# Patient Record
Sex: Female | Born: 1975
Health system: Southern US, Community
[De-identification: ages and names within clinical notes are randomized; demographics above are authoritative.]

## PROBLEM LIST (undated history)

## (undated) DIAGNOSIS — G43009 Migraine without aura, not intractable, without status migrainosus: Secondary | ICD-10-CM

## (undated) DIAGNOSIS — F419 Anxiety disorder, unspecified: Secondary | ICD-10-CM

## (undated) DIAGNOSIS — E785 Hyperlipidemia, unspecified: Secondary | ICD-10-CM

## (undated) HISTORY — DX: Hyperlipidemia, unspecified: E78.5

## (undated) HISTORY — DX: Anxiety disorder, unspecified: F41.9

## (undated) HISTORY — DX: Migraine without aura, not intractable, without status migrainosus: G43.009

---

## 2003-04-28 ENCOUNTER — Other Ambulatory Visit: Admission: RE | Admit: 2003-04-28 | Discharge: 2003-04-28 | Payer: Self-pay | Admitting: Obstetrics and Gynecology

## 2003-11-23 ENCOUNTER — Inpatient Hospital Stay (HOSPITAL_COMMUNITY): Admission: AD | Admit: 2003-11-23 | Discharge: 2003-11-25 | Payer: Self-pay | Admitting: Obstetrics and Gynecology

## 2003-11-24 ENCOUNTER — Encounter (INDEPENDENT_AMBULATORY_CARE_PROVIDER_SITE_OTHER): Payer: Self-pay | Admitting: Specialist

## 2008-09-15 ENCOUNTER — Encounter: Admission: RE | Admit: 2008-09-15 | Discharge: 2008-09-15 | Payer: Self-pay | Admitting: Family Medicine

## 2010-01-26 IMAGING — CT CT HEAD W/O CM
1 series · 16 of 28 positions shown, 20 images · non-contrast
Comparison: None

CLINICAL DATA: Headaches.

CT HEAD WITHOUT CONTRAST
TECHNIQUE: Contiguous axial images were obtained from the base of
the skull through the vertex without contrast.

[Series 2: head · axial · 0.49mm/px · z∈[+18,+153]mm · 16 of 28 slices shown, 20 images]
[im 2/28  brain]
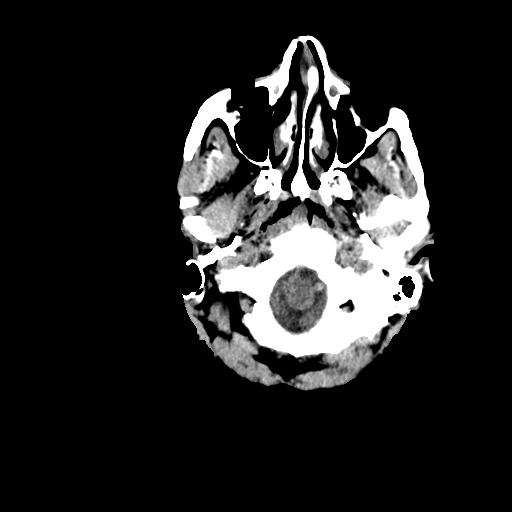
[im 2/28  bone]
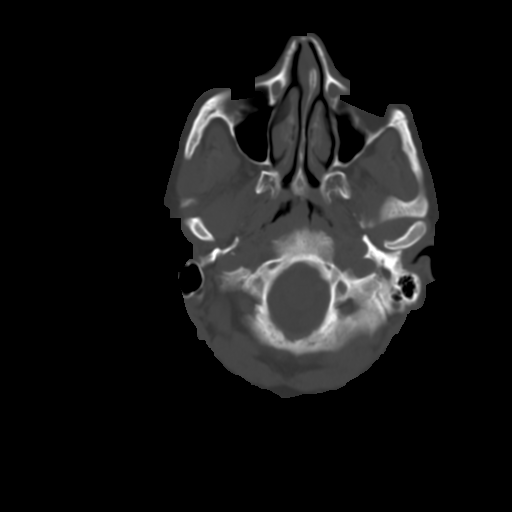
[im 4/28  brain]
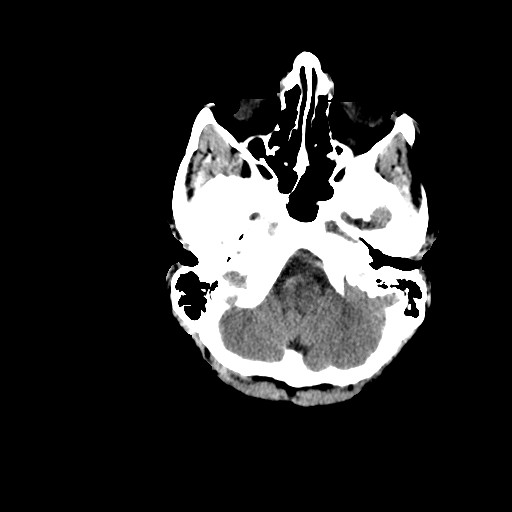
[im 6/28  brain]
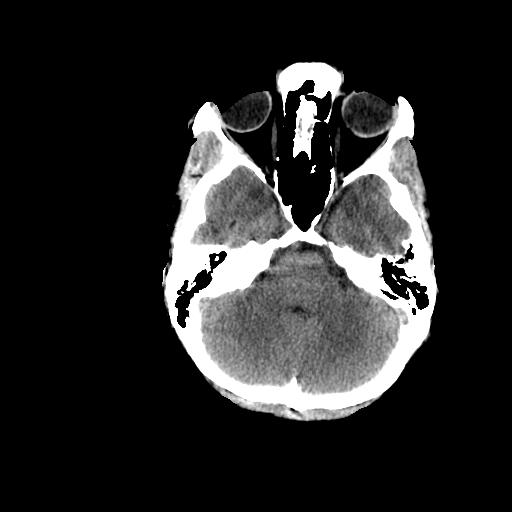
[im 7/28  brain]
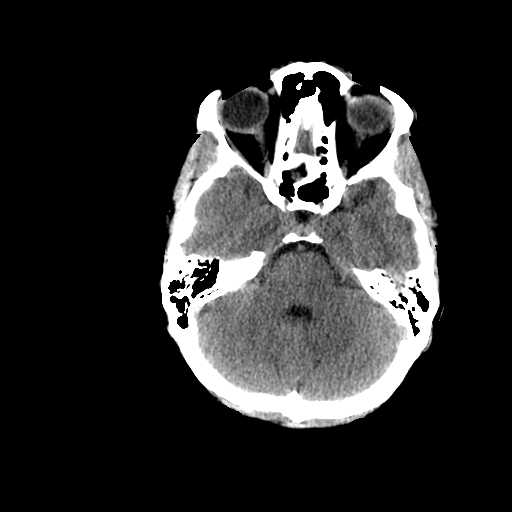
[im 9/28  brain]
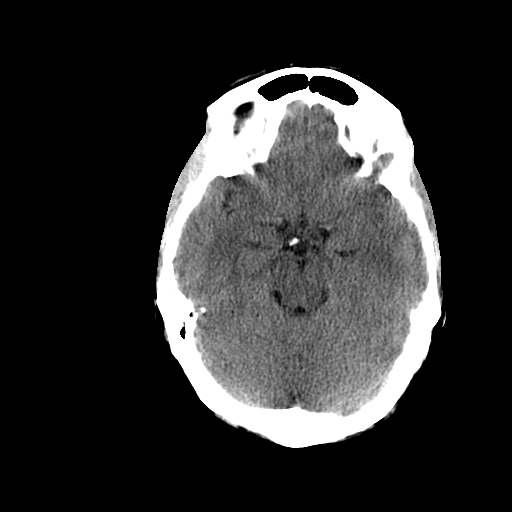
[im 9/28  bone]
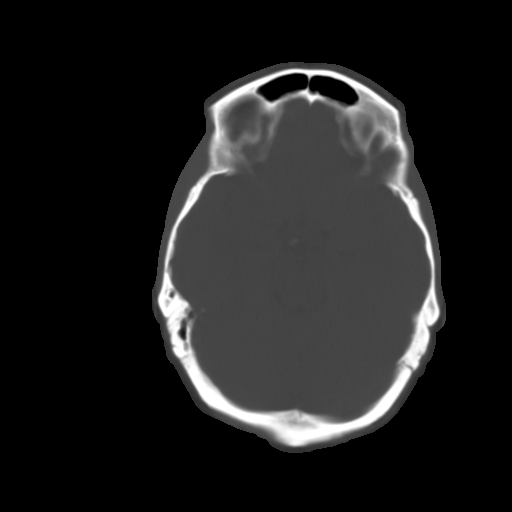
[im 10/28  brain]
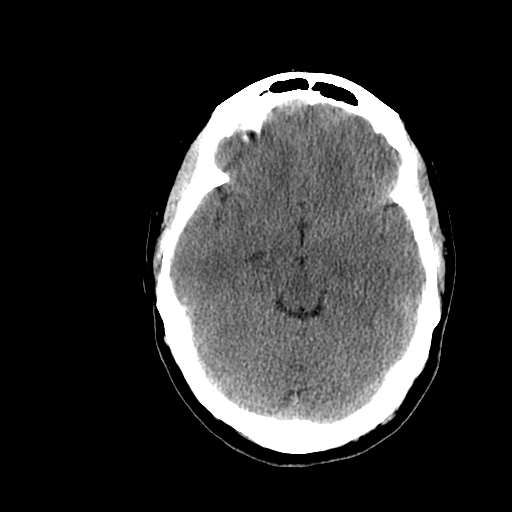
[im 12/28  brain]
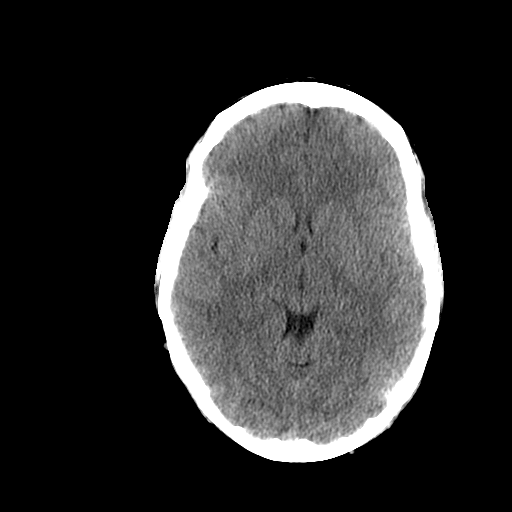
[im 14/28  brain]
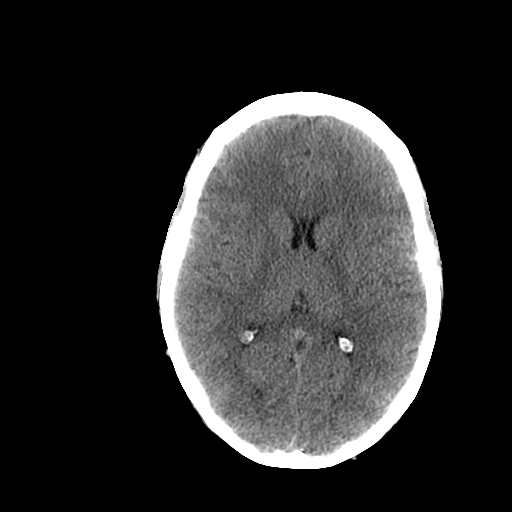
[im 15/28  brain]
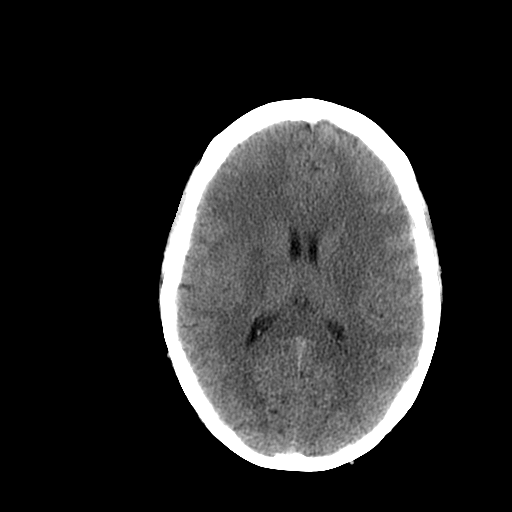
[im 15/28  bone]
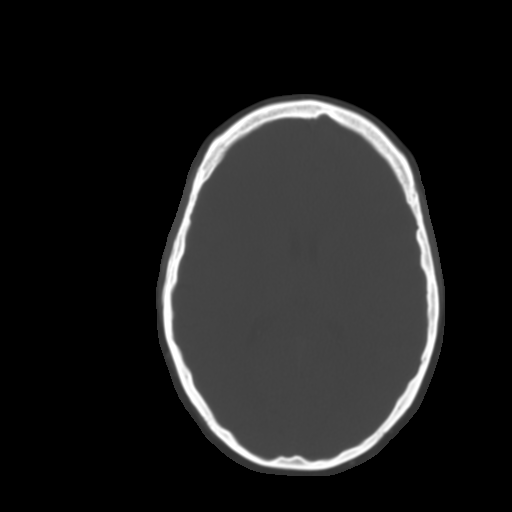
[im 17/28  brain]
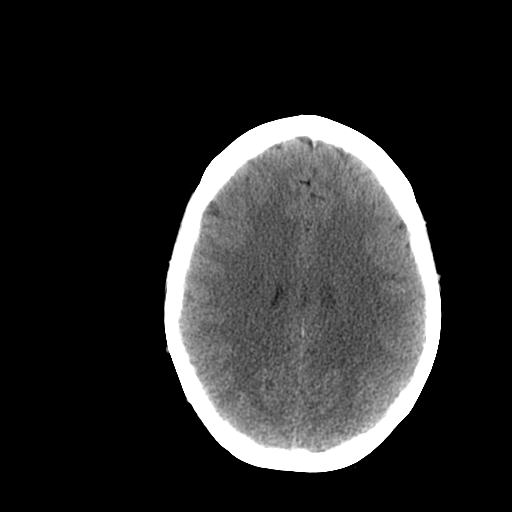
[im 19/28  brain]
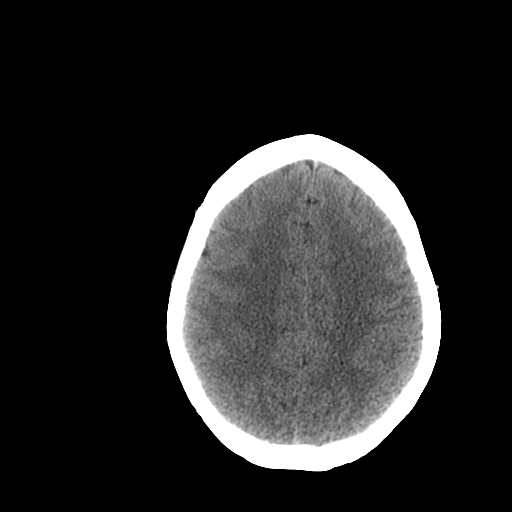
[im 20/28  brain]
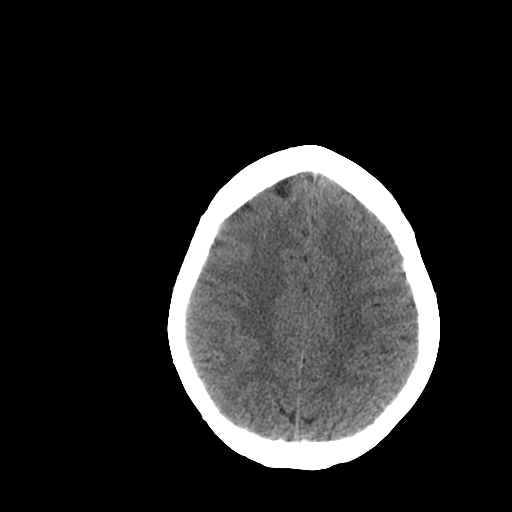
[im 22/28  brain]
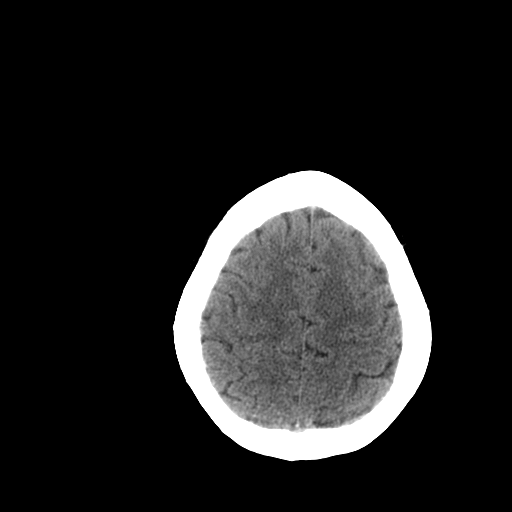
[im 22/28  bone]
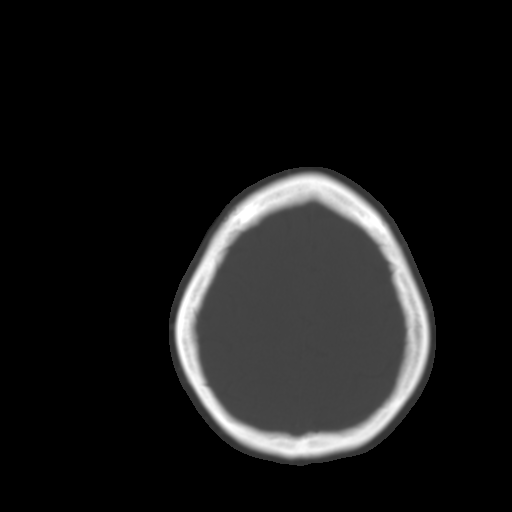
[im 23/28  brain]
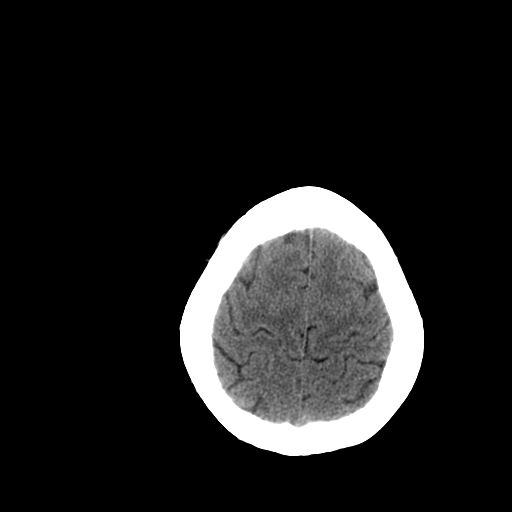
[im 25/28  brain]
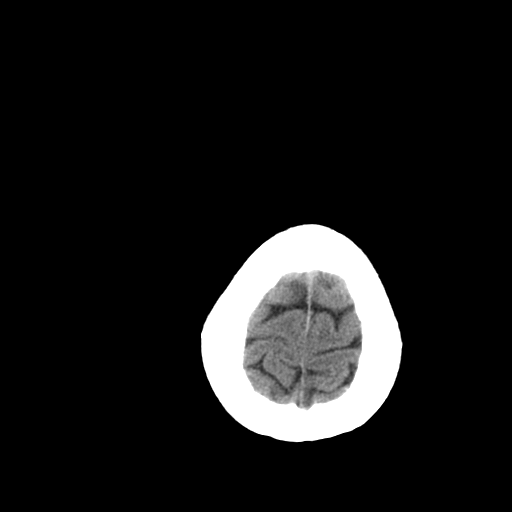
[im 27/28  brain]
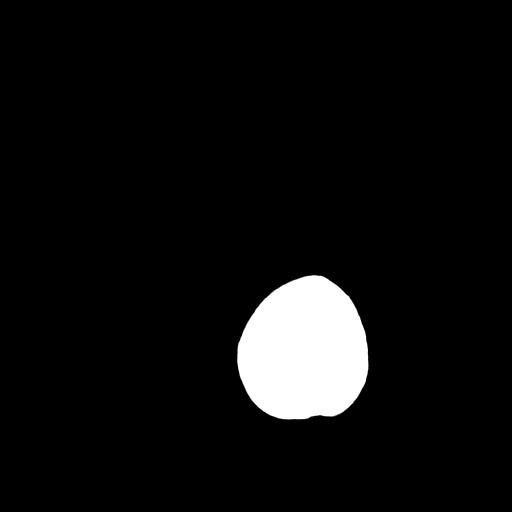

[16 of 28 positions shown; findings below may reference images not displayed]

FINDINGS: Normal appearance of the intracranial structures.  No
evidence for acute hemorrhage, mass lesion, midline shift,
hydrocephalus or large territorial infarct.  The visualized
paranasal sinuses are clear.  No acute bony abnormality.
IMPRESSION: No acute intracranial abnormality.

## 2010-08-02 NOTE — Discharge Summary (Signed)
NAMESEARA, HINESLEY                           ACCOUNT NO.:  [id number]   MEDICAL RECORD NO.:  [id number]                   PATIENT TYPE:  INP   LOCATION:  9138                                 FACILITY:  [facility]   PHYSICIAN:  Gerrit Friends. Aldona Bar, M.D.                DATE OF BIRTH:  [birth year]   DATE OF ADMISSION:  11/23/2003  DATE OF DISCHARGE:  11/25/2003                                 DISCHARGE SUMMARY   DISCHARGE DIAGNOSES:  1.  Term pregnancy, delivered 7 pound female infant, Apgars 8 and 9.  2.  Blood type O positive.  3.  Positive antenatal group B Streptococcus.  4.  Desire for permanent elective sterilization.   PROCEDURES:  1.  Induction of labor.  2.  Intrapartum antibiotics.  3.  Normal spontaneous delivery.  4.  First-degree tear and repair.  5.  Postpartum tubal sterilization.   SUMMARY:  This 35 year old gravida 5, para 4 was admitted for induction at  term.  Her induction went well.  She did receive intrapartum antibiotics.  She was subsequently delivered of a viable female infant weighing 7 pounds  with Apgars of 8 and 9.  There were first-degree tears with repair without  difficulty.  She did request a postpartum tubal sterilization which was  carried out on 9/9 using her epidural as an anesthetic.  That procedure went  well.  On the morning of 9/10, she ambulating well, tolerating a regular  diet, had normal bowel and bladder function.  Her breast-feeding was going  well.  She was comfortable and desirous of discharge.  Discharge hemoglobin  was 11.2 with a white count of 12,100, a platelet count 229,000.   MEDICATIONS ON DISCHARGE:  1.  Vitamins-one a day.  2.  Motrin 600 mg every 6 hours as needed for pain.  3.  Tylox 1-2 every 4-6 hours as needed for severe pain.   She will return to the office for followup in approximately 4 weeks' time or  as needed.   CONDITION ON DISCHARGE:  Improved.                                               Gerrit Friends. Aldona Bar,  M.D.    RMW/MEDQ  D:  11/25/2003  T:  11/25/2003  Job:  [id number]

## 2010-08-02 NOTE — Op Note (Signed)
NAMEGEORGIANNA, Danielle Mcbride                           ACCOUNT NO.:  [id number]   MEDICAL RECORD NO.:  [id number]                   PATIENT TYPE:  INP   LOCATION:  9138                                 FACILITY:  [facility]   PHYSICIAN:  Carrington Clamp, M.D.              DATE OF BIRTH:  1975-03-28   DATE OF PROCEDURE:  11/24/2003  DATE OF DISCHARGE:                                 OPERATIVE REPORT   PREOPERATIVE DIAGNOSES:  Multiparous, desires permanent sterility.   POSTOPERATIVE DIAGNOSES:  Multiparous, desires permanent sterility.   PROCEDURE:  Postpartum tubal ligation modified [place] style.   ATTENDING PHYSICIAN:  Carrington Clamp, M.D.   ANESTHESIA:  Epidural.   ESTIMATED BLOOD LOSS:  Minimal.   IV FLUIDS:  700 mL.   URINE OUTPUT:  Not measured.   COMPLICATIONS:  None.   FINDINGS:  Normal ovaries and tubes.   MEDICATIONS:  Marcaine 0.25% injection into the incision about 10 mL.   COUNTS:  Correct x3.   PATHOLOGY:  Right and left tubal segments.   TECHNIQUE:  After adequate epidural anesthesia was achieved, the patient was  prepped and draped in the usual sterile fashion in the dorsal supine  position. The inferior portion of the umbilicus was grasped with a pair of  Allis's and an incision was made with the scalpel through the skin about 2.5  cm wide. Each layer was subsequently grabbed with Allis's and then incised  carefully with the scalpel.  The peritoneum was opened up carefully with  good visualization of the bowel.  On the left hand side, the left ovary was  brought up through the incision and grasped with the Babcock. The tube  followed and was able to be followed to its fimbriated end.  The isthmic  portion of the tube was grasped with a Babcock and the mesosalpinx was  __________ with Bovie cautery.  Two free hand ties were passed through this  defect and each was tied down to create an intervening knuckle of tube of  approximately 2-3 cm. This knuckle was then  excised with the Metzenbaum  scissors, the mesosalpinx was found to be hemostatic and the tube was  released back into the abdomen. On the right hand side, the tube and ovary  were not able to be brought up through the incision bluntly so a pair of  retractors were placed in the cornua of the right hand side of the uterus.  The right cornua of the uterus was identified. The tube was then grasped  with a Babcock and brought through the incision and followed to its  fimbriated end.  The same procedure was performed on the right hand tube  with good hemostasis of the mesosalpinx. The ovary was unable to be seen  directly on the right hand side but it  palpated to be normal. Everything was removed from the abdomen and the  fascial defect was closed with  running stitch of 2-0 Vicryl. The skin was  closed with Dermabond. The patient tolerated the procedure well and was  returned to the recovery room in stable condition.                                               Carrington Clamp, M.D.    MH/MEDQ  D:  11/24/2003  T:  11/24/2003  Job:  [id number]

## 2014-02-28 DIAGNOSIS — S62001A Unspecified fracture of navicular [scaphoid] bone of right wrist, initial encounter for closed fracture: Secondary | ICD-10-CM | POA: Insufficient documentation

## 2015-02-23 NOTE — Progress Notes (Signed)
 Ms. Mirkin returns to the office today for reexamination of her right wrist scaphoid fracture, questionable scapholunate ligament injury.  She continues to complain of mild discomfort.  She has had her MRI done.  This is read out as being entirely normal.  Review reveals indeed I see no disruption, any interosseous ligament or triangular fibrocartilage complex.  She does have some small cysts present in her lunate.  These are on the ulnar aspect and are more centrally located and are probably benign small cyst.  She is working at the present time with minimal complaints.  We would like to have her see therapy to see if they can increase her strength and endurance.  I will see her back in one month.  She is at regular work at the present time.   Electronically signed by: Arley Lamar Curia, MD 02/23/15 [phone]

## 2016-01-17 DIAGNOSIS — Z23 Encounter for immunization: Secondary | ICD-10-CM | POA: Diagnosis not present

## 2016-02-14 ENCOUNTER — Other Ambulatory Visit: Payer: Self-pay | Admitting: Family Medicine

## 2016-02-14 DIAGNOSIS — R1011 Right upper quadrant pain: Secondary | ICD-10-CM | POA: Diagnosis not present

## 2016-02-22 ENCOUNTER — Ambulatory Visit
Admission: RE | Admit: 2016-02-22 | Discharge: 2016-02-22 | Disposition: A | Discharge status: Home / Self Care | Payer: Self-pay | Source: Ambulatory Visit | Attending: Family Medicine | Admitting: Family Medicine

## 2016-02-22 DIAGNOSIS — R1011 Right upper quadrant pain: Secondary | ICD-10-CM | POA: Diagnosis not present

## 2016-05-26 ENCOUNTER — Other Ambulatory Visit (HOSPITAL_COMMUNITY)
Admission: RE | Admit: 2016-05-26 | Discharge: 2016-05-26 | Disposition: A | Discharge status: Home / Self Care | Payer: BLUE CROSS/BLUE SHIELD | Source: Ambulatory Visit | Attending: Family Medicine | Admitting: Family Medicine

## 2016-05-26 ENCOUNTER — Other Ambulatory Visit: Payer: Self-pay | Admitting: Family Medicine

## 2016-05-26 DIAGNOSIS — Z1151 Encounter for screening for human papillomavirus (HPV): Secondary | ICD-10-CM | POA: Insufficient documentation

## 2016-05-26 DIAGNOSIS — Z23 Encounter for immunization: Secondary | ICD-10-CM | POA: Diagnosis not present

## 2016-05-26 DIAGNOSIS — Z124 Encounter for screening for malignant neoplasm of cervix: Secondary | ICD-10-CM | POA: Diagnosis not present

## 2016-05-26 DIAGNOSIS — Z0000 Encounter for general adult medical examination without abnormal findings: Secondary | ICD-10-CM | POA: Diagnosis not present

## 2016-05-26 DIAGNOSIS — Z13220 Encounter for screening for lipoid disorders: Secondary | ICD-10-CM | POA: Diagnosis not present

## 2016-05-26 DIAGNOSIS — Z131 Encounter for screening for diabetes mellitus: Secondary | ICD-10-CM | POA: Diagnosis not present

## 2016-05-27 LAB — CYTOLOGY - PAP
Diagnosis: NEGATIVE
HPV: NOT DETECTED

## 2016-06-09 ENCOUNTER — Other Ambulatory Visit (HOSPITAL_COMMUNITY): Payer: Self-pay | Admitting: Physician Assistant

## 2016-06-09 DIAGNOSIS — K625 Hemorrhage of anus and rectum: Secondary | ICD-10-CM | POA: Diagnosis not present

## 2016-06-09 DIAGNOSIS — R1011 Right upper quadrant pain: Secondary | ICD-10-CM

## 2016-06-09 DIAGNOSIS — K644 Residual hemorrhoidal skin tags: Secondary | ICD-10-CM | POA: Diagnosis not present

## 2016-06-09 DIAGNOSIS — K5900 Constipation, unspecified: Secondary | ICD-10-CM | POA: Diagnosis not present

## 2016-06-10 DIAGNOSIS — J0100 Acute maxillary sinusitis, unspecified: Secondary | ICD-10-CM | POA: Diagnosis not present

## 2016-06-12 ENCOUNTER — Encounter (HOSPITAL_COMMUNITY): Payer: Self-pay | Admitting: Radiology

## 2016-06-12 ENCOUNTER — Encounter (HOSPITAL_COMMUNITY)
Admission: RE | Admit: 2016-06-12 | Discharge: 2016-06-12 | Disposition: A | Discharge status: Home / Self Care | Payer: BLUE CROSS/BLUE SHIELD | Source: Ambulatory Visit | Attending: Physician Assistant | Admitting: Physician Assistant

## 2016-06-12 DIAGNOSIS — R1011 Right upper quadrant pain: Secondary | ICD-10-CM | POA: Diagnosis not present

## 2016-06-12 MED ORDER — TECHNETIUM TC 99M MEBROFENIN IV KIT
5.0000 | PACK | Freq: Once | INTRAVENOUS | Status: AC | PRN
  Administered 2016-06-12: 5 via INTRAVENOUS

## 2016-06-19 DIAGNOSIS — R195 Other fecal abnormalities: Secondary | ICD-10-CM | POA: Diagnosis not present

## 2016-06-19 DIAGNOSIS — R1011 Right upper quadrant pain: Secondary | ICD-10-CM | POA: Diagnosis not present

## 2016-07-10 DIAGNOSIS — K828 Other specified diseases of gallbladder: Secondary | ICD-10-CM | POA: Diagnosis not present

## 2016-08-12 ENCOUNTER — Other Ambulatory Visit: Payer: Self-pay | Admitting: General Surgery

## 2016-08-12 DIAGNOSIS — K8010 Calculus of gallbladder with chronic cholecystitis without obstruction: Secondary | ICD-10-CM | POA: Diagnosis not present

## 2016-08-12 DIAGNOSIS — K824 Cholesterolosis of gallbladder: Secondary | ICD-10-CM | POA: Diagnosis not present

## 2017-07-04 IMAGING — US US ABDOMEN LIMITED
1 series · 14 of 25 positions shown · non-contrast
Comparison: None.

CLINICAL DATA: Right upper quadrant pain

EXAM:
US ABDOMEN LIMITED - RIGHT UPPER QUADRANT

[Series 1: us abdomen limited · 0.24mm/px · 14 of 46 slices shown]
[im 1/46]
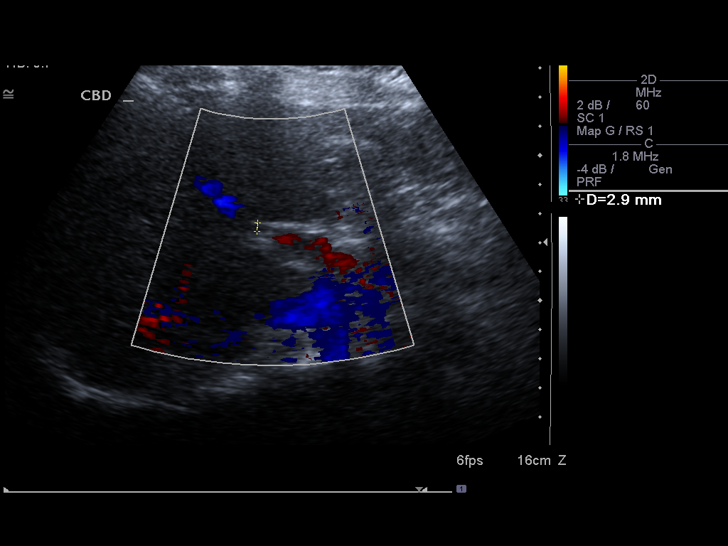
[im 4/46]
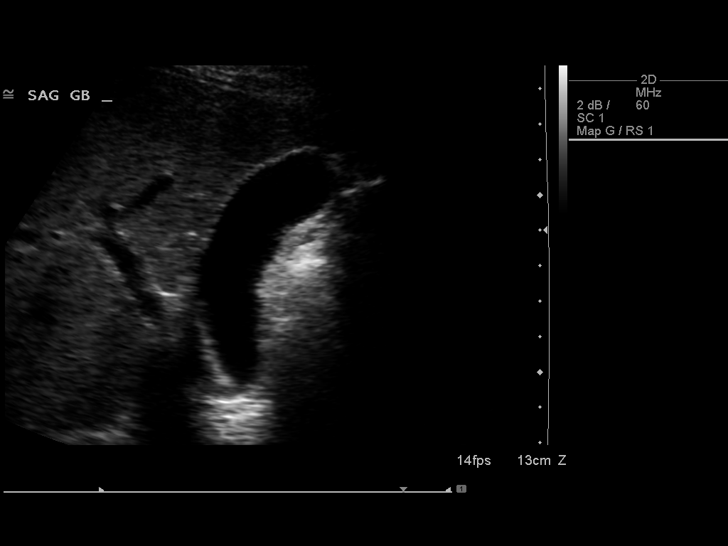
[im 8/46]
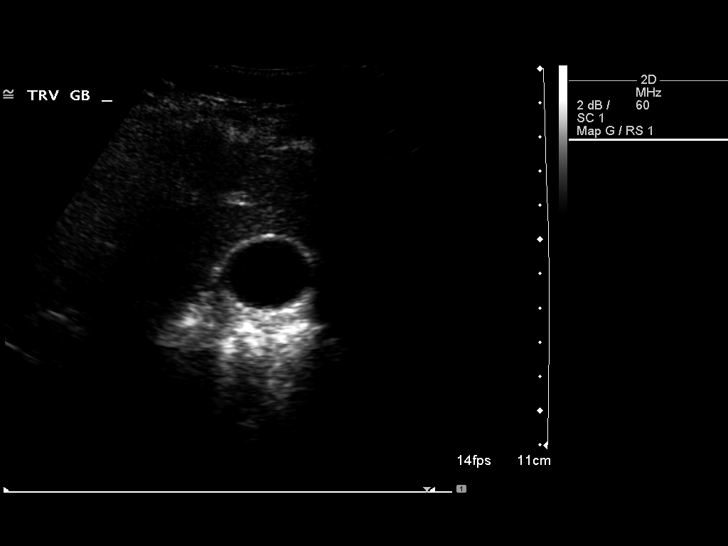
[im 12/46]
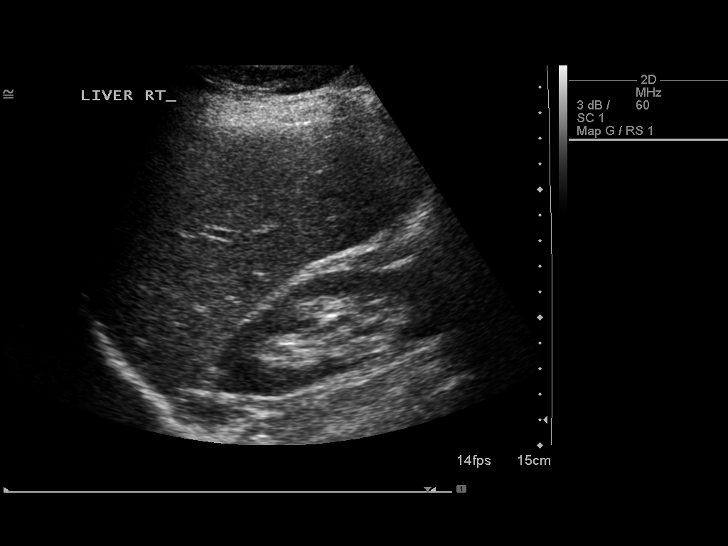
[im 16/46]
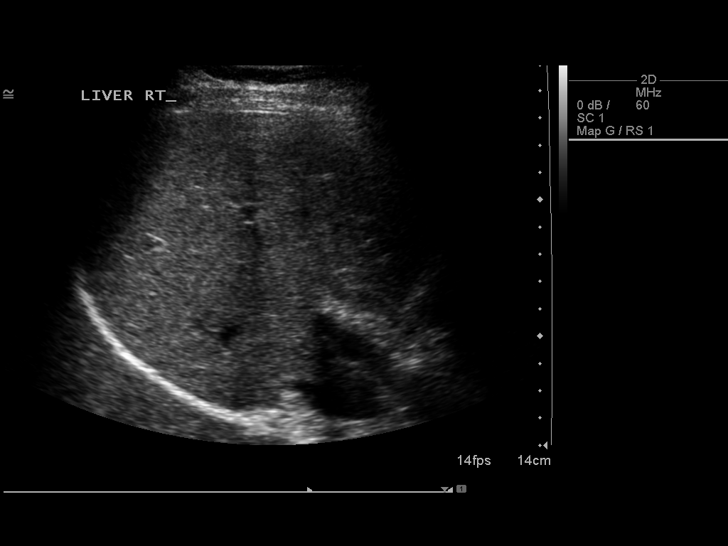
[im 17/46]
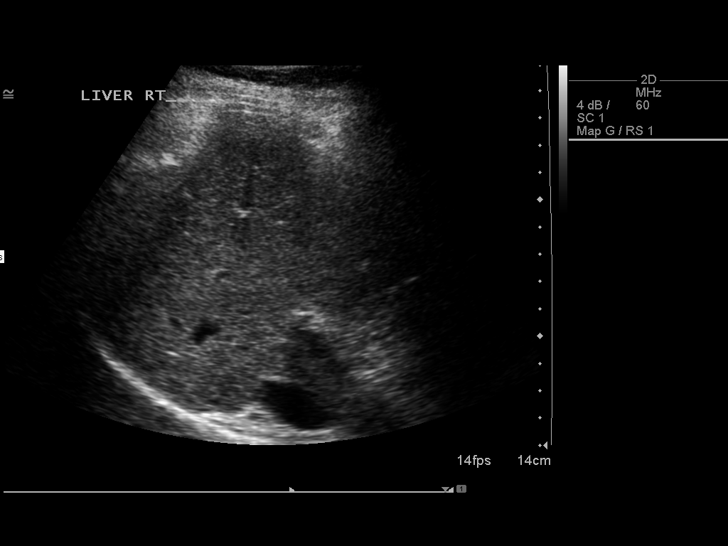
[im 21/46]
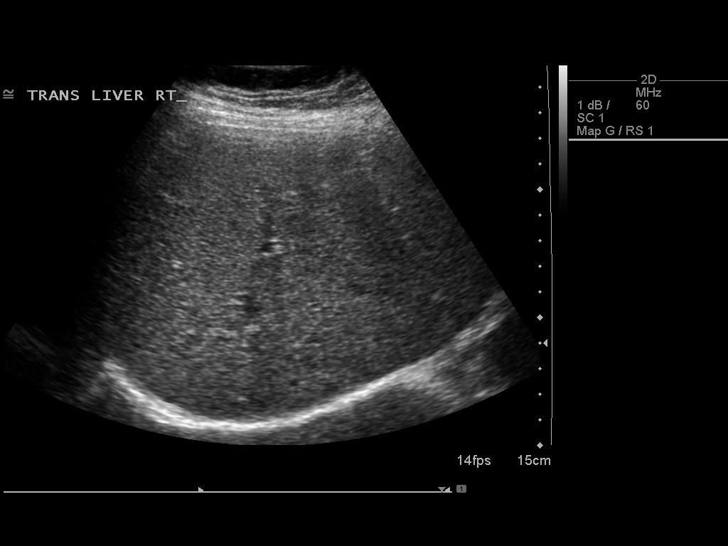
[im 25/46]
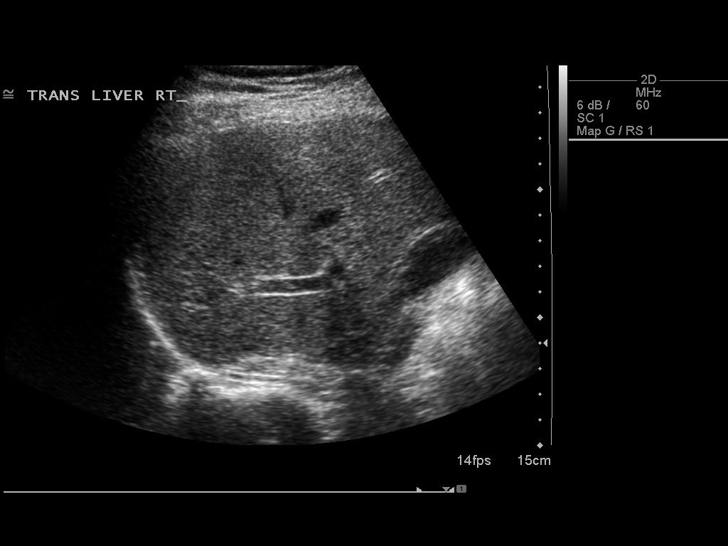
[im 29/46]
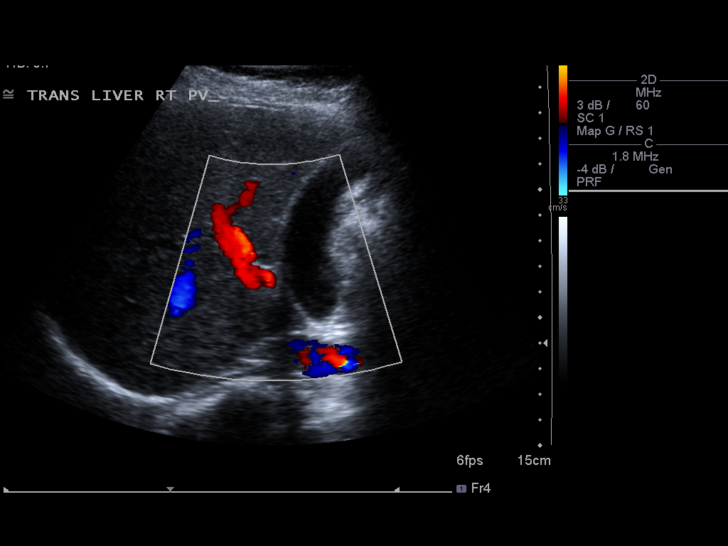
[im 31/46]
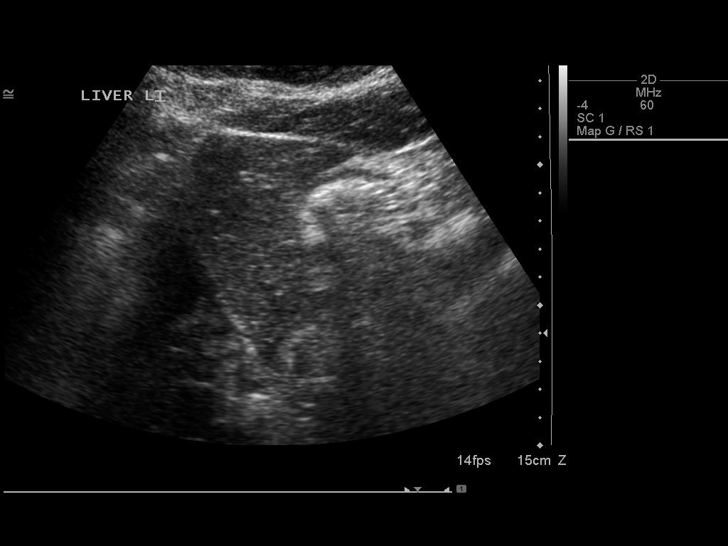
[im 34/46]
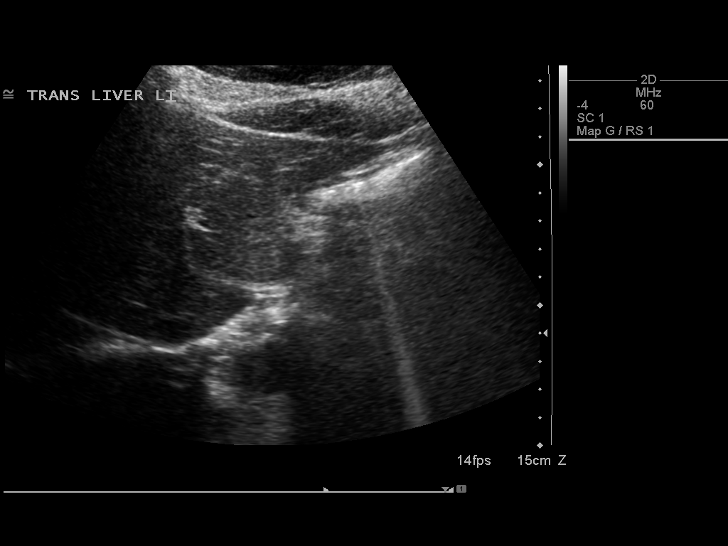
[im 38/46]
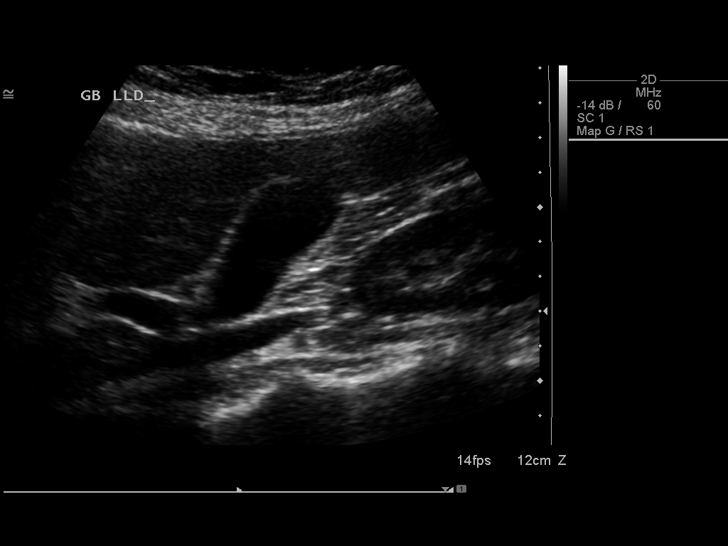
[im 42/46]
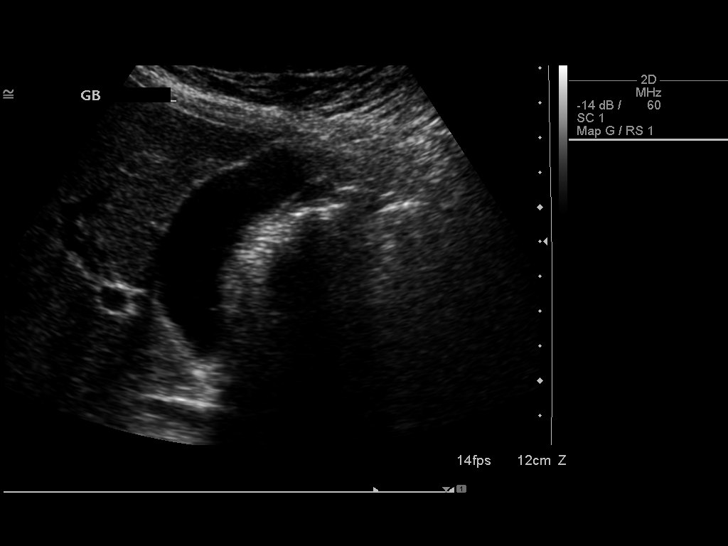
[im 46/46]
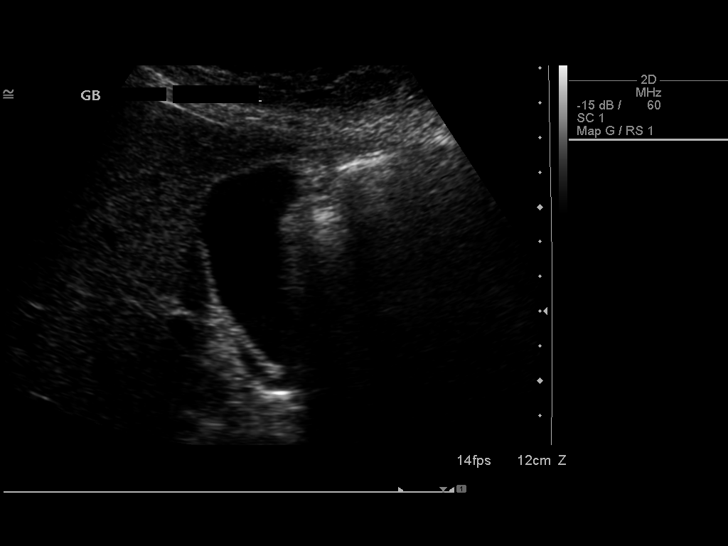

[14 of 25 positions shown; findings below may reference images not displayed]

FINDINGS: Gallbladder:

No gallstones or wall thickening visualized. No sonographic Murphy
sign noted by sonographer.

Common bile duct:

Diameter: 2.9 mm

Liver:

No focal lesion identified. Within normal limits in parenchymal
echogenicity.
IMPRESSION: 1. No cause for the patient's symptoms identified.

## 2018-12-12 DIAGNOSIS — Z23 Encounter for immunization: Secondary | ICD-10-CM | POA: Diagnosis not present

## 2018-12-14 DIAGNOSIS — Z20828 Contact with and (suspected) exposure to other viral communicable diseases: Secondary | ICD-10-CM | POA: Diagnosis not present

## 2019-01-24 DIAGNOSIS — F419 Anxiety disorder, unspecified: Secondary | ICD-10-CM | POA: Diagnosis not present

## 2019-01-24 DIAGNOSIS — Z13220 Encounter for screening for lipoid disorders: Secondary | ICD-10-CM | POA: Diagnosis not present

## 2019-01-24 DIAGNOSIS — Z01419 Encounter for gynecological examination (general) (routine) without abnormal findings: Secondary | ICD-10-CM | POA: Diagnosis not present

## 2019-01-24 DIAGNOSIS — Z1331 Encounter for screening for depression: Secondary | ICD-10-CM | POA: Diagnosis not present

## 2019-01-24 DIAGNOSIS — Z131 Encounter for screening for diabetes mellitus: Secondary | ICD-10-CM | POA: Diagnosis not present

## 2019-01-24 DIAGNOSIS — E669 Obesity, unspecified: Secondary | ICD-10-CM | POA: Diagnosis not present

## 2019-01-28 DIAGNOSIS — Z7189 Other specified counseling: Secondary | ICD-10-CM | POA: Diagnosis not present

## 2019-01-28 DIAGNOSIS — Z20828 Contact with and (suspected) exposure to other viral communicable diseases: Secondary | ICD-10-CM | POA: Diagnosis not present

## 2019-01-28 DIAGNOSIS — R197 Diarrhea, unspecified: Secondary | ICD-10-CM | POA: Diagnosis not present

## 2019-01-28 DIAGNOSIS — R110 Nausea: Secondary | ICD-10-CM | POA: Diagnosis not present

## 2019-01-29 DIAGNOSIS — Z20828 Contact with and (suspected) exposure to other viral communicable diseases: Secondary | ICD-10-CM | POA: Diagnosis not present

## 2019-01-30 DIAGNOSIS — Z20828 Contact with and (suspected) exposure to other viral communicable diseases: Secondary | ICD-10-CM | POA: Diagnosis not present

## 2019-10-24 DIAGNOSIS — Z20822 Contact with and (suspected) exposure to covid-19: Secondary | ICD-10-CM | POA: Diagnosis not present

## 2019-12-23 DIAGNOSIS — Z20822 Contact with and (suspected) exposure to covid-19: Secondary | ICD-10-CM | POA: Diagnosis not present

## 2020-03-01 DIAGNOSIS — I4510 Unspecified right bundle-branch block: Secondary | ICD-10-CM | POA: Diagnosis not present

## 2020-03-01 DIAGNOSIS — E785 Hyperlipidemia, unspecified: Secondary | ICD-10-CM | POA: Diagnosis not present

## 2020-03-01 DIAGNOSIS — F411 Generalized anxiety disorder: Secondary | ICD-10-CM | POA: Diagnosis not present

## 2020-03-01 DIAGNOSIS — I10 Essential (primary) hypertension: Secondary | ICD-10-CM | POA: Diagnosis not present

## 2020-03-07 ENCOUNTER — Telehealth: Payer: Self-pay

## 2020-03-07 NOTE — Telephone Encounter (Signed)
NOTES ON FILE FROM [facility] [phone], SENT REFERRAL TO SCHEDULING

## 2020-03-17 HISTORY — PX: CERVICAL SPINE SURGERY: SHX589

## 2020-03-27 DIAGNOSIS — Z20822 Contact with and (suspected) exposure to covid-19: Secondary | ICD-10-CM | POA: Diagnosis not present

## 2020-03-29 ENCOUNTER — Ambulatory Visit: Payer: BLUE CROSS/BLUE SHIELD | Admitting: Internal Medicine

## 2020-04-07 DIAGNOSIS — Z20822 Contact with and (suspected) exposure to covid-19: Secondary | ICD-10-CM | POA: Diagnosis not present

## 2020-04-09 ENCOUNTER — Ambulatory Visit: Payer: Self-pay | Admitting: Interventional Cardiology

## 2020-04-17 DIAGNOSIS — Z20822 Contact with and (suspected) exposure to covid-19: Secondary | ICD-10-CM | POA: Diagnosis not present

## 2020-04-17 DIAGNOSIS — Z03818 Encounter for observation for suspected exposure to other biological agents ruled out: Secondary | ICD-10-CM | POA: Diagnosis not present

## 2020-05-04 ENCOUNTER — Encounter: Payer: Self-pay | Admitting: Interventional Cardiology

## 2020-05-04 ENCOUNTER — Other Ambulatory Visit: Payer: Self-pay

## 2020-05-04 ENCOUNTER — Ambulatory Visit (INDEPENDENT_AMBULATORY_CARE_PROVIDER_SITE_OTHER): Payer: BC Managed Care – PPO | Admitting: Interventional Cardiology

## 2020-05-04 ENCOUNTER — Ambulatory Visit (INDEPENDENT_AMBULATORY_CARE_PROVIDER_SITE_OTHER): Payer: BC Managed Care – PPO

## 2020-05-04 VITALS — BP 114/74 | HR 73 | Ht 69.0 in | Wt 222.0 lb

## 2020-05-04 DIAGNOSIS — I10 Essential (primary) hypertension: Secondary | ICD-10-CM | POA: Diagnosis not present

## 2020-05-04 DIAGNOSIS — R002 Palpitations: Secondary | ICD-10-CM

## 2020-05-04 DIAGNOSIS — I4510 Unspecified right bundle-branch block: Secondary | ICD-10-CM | POA: Diagnosis not present
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
## 2020-05-04 NOTE — Patient Instructions (Signed)
Medication Instructions:  Your physician recommends that you continue on your current medications as directed. Please refer to the Current Medication list given to you today.  *If you need a refill on your cardiac medications before your next appointment, please call your pharmacy*   Lab Work: none If you have labs (blood work) drawn today and your tests are completely normal, you will receive your results only by: Marland Kitchen MyChart Message (if you have MyChart) OR . A paper copy in the mail If you have any lab test that is abnormal or we need to change your treatment, we will call you to review the results.   Testing/Procedures: Your physician has recommended that you wear a holter monitor. Holter monitors are medical devices that record the heart's electrical activity. Doctors most often use these monitors to diagnose arrhythmias. Arrhythmias are problems with the speed or rhythm of the heartbeat. The monitor is a small, portable device. You can wear one while you do your normal daily activities. This is usually used to diagnose what is causing palpitations/syncope (passing out).     Follow-Up: At [facility], you and your health needs are our priority.  As part of our continuing mission to provide you with exceptional heart care, we have created designated Provider Care Teams.  These Care Teams include your primary Cardiologist (physician) and Advanced Practice Providers (APPs -  Physician Assistants and Nurse Practitioners) who all work together to provide you with the care you need, when you need it.  We recommend signing up for the patient portal called "MyChart".  Sign up information is provided on this After Visit Summary.  MyChart is used to connect with patients for Virtual Visits (Telemedicine).  Patients are able to view lab/test results, encounter notes, upcoming appointments, etc.  Non-urgent messages can be sent to your provider as well.   To learn more about what you can do with  MyChart, go to [email or web address].    Your next appointment:   As needed  The format for your next appointment:   In Person  Provider:   You may see Lance Muss, MD or one of the following Advanced Practice Providers on your designated Care Team:    Ronie Spies, PA-C  Jacolyn Reedy, PA-C    Other Instructions Danielle Mcbride- Long Term Monitor Instructions   Your physician has requested you wear your ZIO patch monitor____14___days.   This is a single patch monitor.  [employer] supplies one patch monitor per enrollment.  Additional stickers are not available.   Please do not apply patch if you will be having a Nuclear Stress Test, Echocardiogram, Cardiac CT, MRI, or Chest Xray during the time frame you would be wearing the monitor. The patch cannot be worn during these tests.  You cannot remove and re-apply the ZIO XT patch monitor.   Your ZIO patch monitor will be sent USPS Priority mail from [facility] directly to your home address. The monitor may also be mailed to a PO BOX if home delivery is not available.   It may take 3-5 days to receive your monitor after you have been enrolled.   Once you have received you monitor, please review enclosed instructions.  Your monitor has already been registered assigning a specific monitor serial # to you.   Applying the monitor   Shave hair from upper left chest.   Hold abrader disc by orange tab.  Rub abrader in 40 strokes over left upper chest as indicated in your monitor instructions.  Clean area with 4 enclosed alcohol pads .  Use all pads to assure are is cleaned thoroughly.  Let dry.   Apply patch as indicated in monitor instructions.  Patch will be place under collarbone on left side of chest with arrow pointing upward.   Rub patch adhesive wings for 2 minutes.Remove white label marked "1".  Remove white label marked "2".  Rub patch adhesive wings for 2 additional minutes.   While looking in a mirror, press and release  button in center of patch.  A small green light will flash 3-4 times .  This will be your only indicator the monitor has been turned on.     Do not shower for the first 24 hours.  You may shower after the first 24 hours.   Press button if you feel a symptom. You will hear a small click.  Record Date, Time and Symptom in the Patient Log Book.   When you are ready to remove patch, follow instructions on last 2 pages of Patient Log Book.  Stick patch monitor onto last page of Patient Log Book.   Place Patient Log Book in [facility] box.  Use locking tab on box and tape box closed securely.  The [employer] and [employer] has [employer] on it.  Please place in mailbox as soon as possible.  Your physician should have your test results approximately 7 days after the monitor has been mailed back to [facility].   Call [facility] at [phone] if you have questions regarding your ZIO XT patch monitor.  Call them immediately if you see an orange light blinking on your monitor.   If your monitor falls off in less than 4 days contact our Monitor department at [phone].  If your monitor becomes loose or falls off after 4 days call [employer] at [phone] for suggestions on securing your monitor.

## 2020-05-09 DIAGNOSIS — R002 Palpitations: Secondary | ICD-10-CM | POA: Diagnosis not present

## 2020-05-29 DIAGNOSIS — R002 Palpitations: Secondary | ICD-10-CM | POA: Diagnosis not present

## 2020-06-27 ENCOUNTER — Other Ambulatory Visit: Payer: Self-pay | Admitting: Orthopedic Surgery

## 2020-06-27 DIAGNOSIS — M542 Cervicalgia: Secondary | ICD-10-CM

## 2020-08-14 DIAGNOSIS — M5412 Radiculopathy, cervical region: Secondary | ICD-10-CM | POA: Diagnosis not present

## 2020-08-14 DIAGNOSIS — M50023 Cervical disc disorder at C6-C7 level with myelopathy: Secondary | ICD-10-CM | POA: Diagnosis not present

## 2020-08-14 DIAGNOSIS — M4802 Spinal stenosis, cervical region: Secondary | ICD-10-CM | POA: Diagnosis not present

## 2020-08-14 DIAGNOSIS — M79602 Pain in left arm: Secondary | ICD-10-CM | POA: Diagnosis not present

## 2022-07-17 DIAGNOSIS — D509 Iron deficiency anemia, unspecified: Secondary | ICD-10-CM | POA: Diagnosis not present

## 2022-12-19 DIAGNOSIS — G8929 Other chronic pain: Secondary | ICD-10-CM | POA: Diagnosis not present

## 2022-12-19 DIAGNOSIS — Z23 Encounter for immunization: Secondary | ICD-10-CM | POA: Diagnosis not present

## 2022-12-19 DIAGNOSIS — M542 Cervicalgia: Secondary | ICD-10-CM | POA: Diagnosis not present

## 2022-12-19 DIAGNOSIS — I10 Essential (primary) hypertension: Secondary | ICD-10-CM | POA: Diagnosis not present

## 2022-12-19 DIAGNOSIS — E785 Hyperlipidemia, unspecified: Secondary | ICD-10-CM | POA: Diagnosis not present

## 2022-12-19 DIAGNOSIS — F411 Generalized anxiety disorder: Secondary | ICD-10-CM | POA: Diagnosis not present

## 2022-12-19 DIAGNOSIS — Z6838 Body mass index (BMI) 38.0-38.9, adult: Secondary | ICD-10-CM | POA: Diagnosis not present

## 2023-10-27 ENCOUNTER — Encounter: Payer: Self-pay | Admitting: Diagnostic Neuroimaging

## 2023-10-27 ENCOUNTER — Ambulatory Visit: Admitting: Diagnostic Neuroimaging

## 2023-10-27 VITALS — BP 129/76 | HR 76 | Ht 69.0 in | Wt 280.0 lb

## 2023-10-27 DIAGNOSIS — R200 Anesthesia of skin: Secondary | ICD-10-CM
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
## 2023-11-20 ENCOUNTER — Ambulatory Visit
Admission: RE | Admit: 2023-11-20 | Discharge: 2023-11-20 | Disposition: A | Discharge status: Home / Self Care | Source: Ambulatory Visit | Attending: Diagnostic Neuroimaging | Admitting: Diagnostic Neuroimaging

## 2023-11-20 DIAGNOSIS — R200 Anesthesia of skin: Secondary | ICD-10-CM | POA: Diagnosis not present

## 2023-11-20 MED ORDER — GADOPICLENOL 0.5 MMOL/ML IV SOLN
10.0000 mL | Freq: Once | INTRAVENOUS | Status: AC | PRN
  Administered 2023-11-20: 10 mL via INTRAVENOUS

## 2023-11-21 ENCOUNTER — Ambulatory Visit: Payer: Self-pay | Admitting: Diagnostic Neuroimaging

## 2023-11-21 DIAGNOSIS — R200 Anesthesia of skin: Secondary | ICD-10-CM

## 2024-01-26 ENCOUNTER — Telehealth: Payer: Self-pay | Admitting: Neurology

## 2024-01-26 NOTE — Telephone Encounter (Signed)
 Patient reschedule appointment due to husband in hospice and have to visit with him.

## 2024-01-27 ENCOUNTER — Encounter: Admitting: Neurology

## 2024-04-20 ENCOUNTER — Ambulatory Visit (INDEPENDENT_AMBULATORY_CARE_PROVIDER_SITE_OTHER): Admitting: Neurology

## 2024-04-20 VITALS — BP 130/84

## 2024-04-20 DIAGNOSIS — R200 Anesthesia of skin: Secondary | ICD-10-CM | POA: Diagnosis not present

## 2024-04-20 DIAGNOSIS — G5603 Carpal tunnel syndrome, bilateral upper limbs: Secondary | ICD-10-CM | POA: Diagnosis not present

## 2024-04-20 NOTE — Procedures (Signed)
 "         Full Name: Danielle Mcbride Gender: Female MRN #: [id number] Date of Birth: 12-03-1975    Visit Date: 04/20/2024 08:36 Age: 49 Years Examining Physician: Onita Duos Referring Physician: Mila Height: 5 feet 9 inch History: 49 year old female presenting with intermittent bilateral hands paresthesia, occasionally foot involvement  Summary of the test: Nerve conduction study: Right sural, superficial peroneal sensory responses were normal. Right peroneal to EDB tibial motor responses were normal.  Bilateral ulnar sensory and motor responses were normal. Bilateral median sensory response showed mild prolonged peak latency, right side has mildly decreased snap amplitude. Right median motor response showed mildly prolonged distal latency was otherwise normal.  Left median motor responses were normal.  Electromyography: Selected needle examination of right upper, lower extremity muscles, cervical and lumbosacral paraspinal muscles were normal.  Conclusion: This is an abnormal study.  There is electrodiagnostic evidence of bilateral distal median neuropathy across the wrist, consistent with bilateral carpal tunnel syndrome; right side is moderate, left side is mild, demyelinating in nature.    ------------------------------- Duos Onita. M.D. Ph.D.   [facility] [address] [place], [place] [address] Tel: [phone] Fax: [phone]  Verbal informed consent was obtained from the patient, patient was informed of potential risk of procedure, including bruising, bleeding, hematoma formation, infection, muscle weakness, muscle pain, numbness, among others.        MNC    Nerve / Sites Muscle Latency Ref. Amplitude Ref. Rel Amp Segments Distance Velocity Ref. Area    ms ms mV mV %  cm m/s m/s mVms  R Median - APB     Wrist APB 5.1 <=4.4 4.6 >=4.0 100 Wrist - APB 7   20.0     Upper arm APB 9.3  4.6  99.2 Upper arm - Wrist 22 53 >=49 20.7  L Median  - APB     Wrist APB 4.0 <=4.4 7.5 >=4.0 100 Wrist - APB 7   26.8     Upper arm APB 8.1  7.4  98.4 Upper arm - Wrist 22 54 >=49 27.5  R Ulnar - ADM     Wrist ADM 2.0 <=3.3 9.3 >=6.0 100 Wrist - ADM 7   27.2     B.Elbow ADM 4.1  8.0  86.4 B.Elbow - Wrist 14 67 >=49 25.4     A.Elbow ADM 6.3  9.2  114 A.Elbow - B.Elbow 13 60 >=49 29.3  L Ulnar - ADM     Wrist ADM 2.0 <=3.3 9.0 >=6.0 100 Wrist - ADM 7   36.3     B.Elbow ADM 4.5  8.9  99.6 B.Elbow - Wrist 14 57 >=49 36.6     A.Elbow ADM 6.7  8.6  95.9 A.Elbow - B.Elbow 13 60 >=49 35.0  R Peroneal - EDB     Ankle EDB 4.9 <=6.5 3.3 >=2.0 100 Ankle - EDB 9   11.8     Fib head EDB 12.3  3.1  92.5 Fib head - Ankle 34 46 >=44 11.3     Pop fossa EDB 13.6  3.1  101 Pop fossa - Fib head 6 44 >=44 14.8         Pop fossa - Ankle      R Tibial - AH     Ankle AH 3.9 <=5.8 6.5 >=4.0 100 Ankle - AH 9   17.7     Pop fossa AH 14.4  5.8  89.9 Pop fossa - Ankle 45 43 >=  41 17.9                 SNC    Nerve / Sites Rec. Site Peak Lat Ref.  Amp Ref. Segments Distance    ms ms V V  cm  R Sural - Ankle (Calf)     Calf Ankle 4.3 <=4.4 9 >=6 Calf - Ankle 14  R Superficial peroneal - Ankle     Lat leg Ankle 4.0 <=4.4 7 >=6 Lat leg - Ankle 14  R Median - Orthodromic (Dig II, Mid palm)     Dig II Wrist 3.9 <=3.4 9 >=10 Dig II - Wrist 13  L Median - Orthodromic (Dig II, Mid palm)     Dig II Wrist 4.1 <=3.4 14 >=10 Dig II - Wrist 13  R Ulnar - Orthodromic, (Dig V, Mid palm)     Dig V Wrist 2.3 <=3.1 7 >=5 Dig V - Wrist 11  L Ulnar - Orthodromic, (Dig V, Mid palm)     Dig V Wrist 2.3 <=3.1 10 >=5 Dig V - Wrist 74                 F  Wave    Nerve F Lat Ref.   ms ms  R Ulnar - ADM 26.6 <=32.0  L Ulnar - ADM 26.7 <=32.0  R Tibial - AH 54.4 <=56.0           EMG Summary Table    Spontaneous MUAP Recruitment  Muscle IA Fib PSW Fasc Other Amp Dur. Poly Pattern  R. First dorsal interosseous Normal None None None _______ Normal Normal Normal Normal  R. Abductor  pollicis brevis Normal None None None _______ Normal Normal Normal Reduced  R. Pronator teres Normal None None None _______ Normal Normal Normal Normal  R. Biceps brachii Normal None None None _______ Normal Normal Normal Normal  R. Deltoid Normal None None None _______ Normal Normal Normal Normal  R. Triceps brachii Normal None None None _______ Normal Normal Normal Normal  R. Cervical paraspinals Normal None None None _______ Normal Normal Normal Normal  R. Tibialis anterior Normal None None None _______ Normal Normal Normal Normal  R. Tibialis posterior Normal None None None _______ Normal Normal Normal Normal  R. Gastrocnemius (Medial head) Normal None None None _______ Normal Normal Normal Normal  R. Vastus lateralis Normal None None None _______ Normal Normal Normal Normal  R. Lumbar paraspinals (low) Normal None None None _______ Normal Normal Normal Normal  R. Lumbar paraspinals (mid) Normal None None None _______ Normal Normal Normal Normal     "
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
